# Patient Record
Sex: Female | Born: 1970 | Race: Black or African American | Hispanic: No | Marital: Single
Health system: Southern US, Community
[De-identification: ages and names within clinical notes are randomized; demographics above are authoritative.]

---

## 2008-03-20 ENCOUNTER — Ambulatory Visit: Payer: Self-pay | Admitting: Diagnostic Radiology

## 2008-03-20 ENCOUNTER — Emergency Department (HOSPITAL_BASED_OUTPATIENT_CLINIC_OR_DEPARTMENT_OTHER): Admission: EM | Admit: 2008-03-20 | Discharge: 2008-03-20 | Payer: Self-pay | Admitting: Emergency Medicine

## 2008-03-27 ENCOUNTER — Emergency Department (HOSPITAL_BASED_OUTPATIENT_CLINIC_OR_DEPARTMENT_OTHER): Admission: EM | Admit: 2008-03-27 | Discharge: 2008-03-27 | Payer: Self-pay | Admitting: Emergency Medicine

## 2008-03-27 ENCOUNTER — Ambulatory Visit: Payer: Self-pay | Admitting: Radiology

## 2009-12-29 IMAGING — CT CT HEAD W/O CM
1 series · 16 of 30 positions shown, 20 images · non-contrast
Comparison: None available.

CLINICAL DATA: Migraine headache x 2 days.

CT HEAD WITHOUT CONTRAST
TECHNIQUE: Contiguous axial images were obtained from the base of
the skull through the vertex without contrast.

[Series 2: head 4.8 h37s · axial · 0.46mm/px · z∈[-145,+7]mm · 16 of 36 slices shown, 20 images]
[im 2/36  brain]
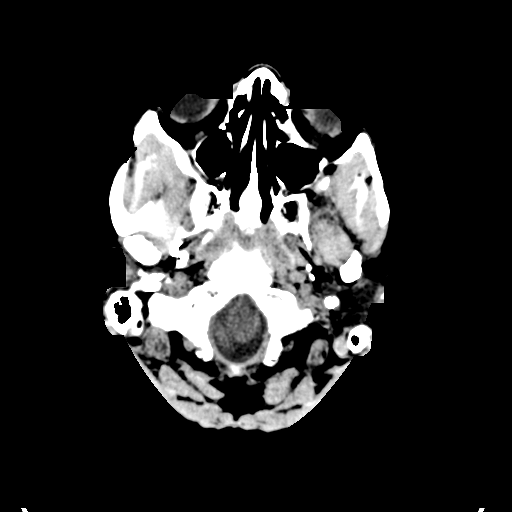
[im 2/36  bone]
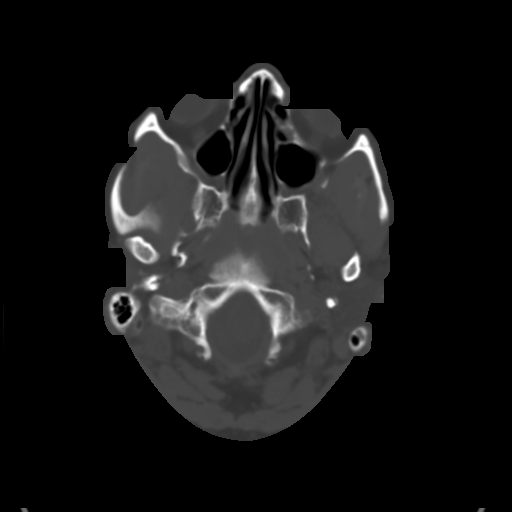
[im 4/36  brain]
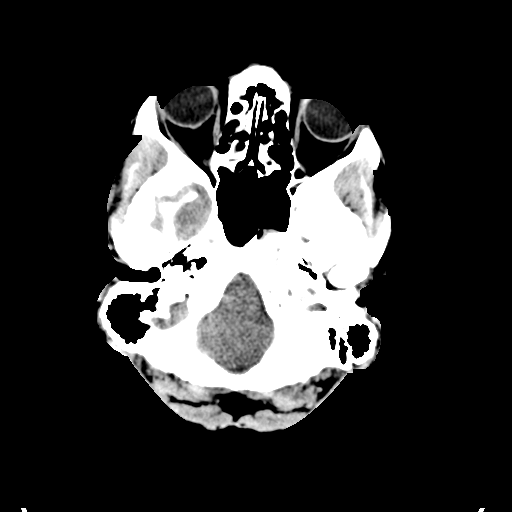
[im 7/36  brain]
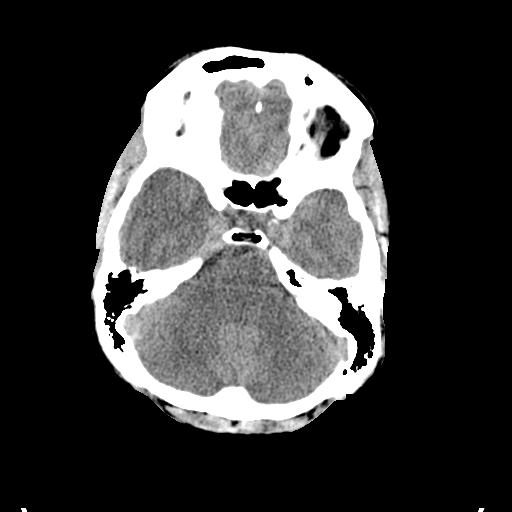
[im 9/36  brain]
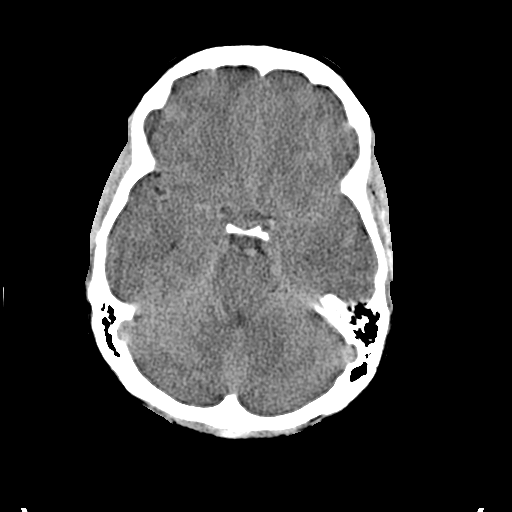
[im 10/36  brain]
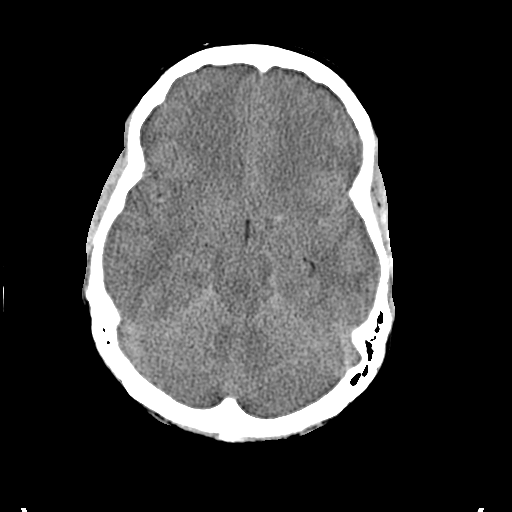
[im 10/36  bone]
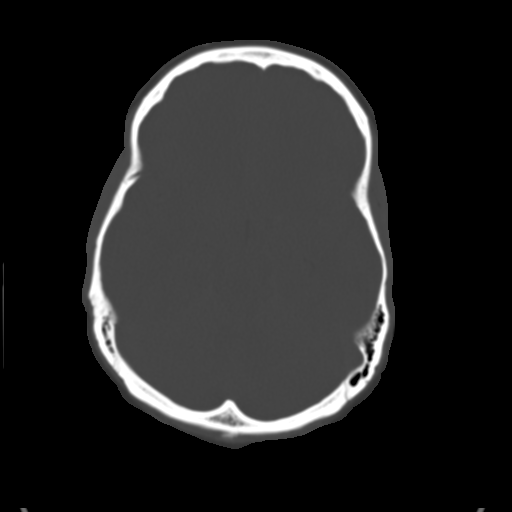
[im 13/36  brain]
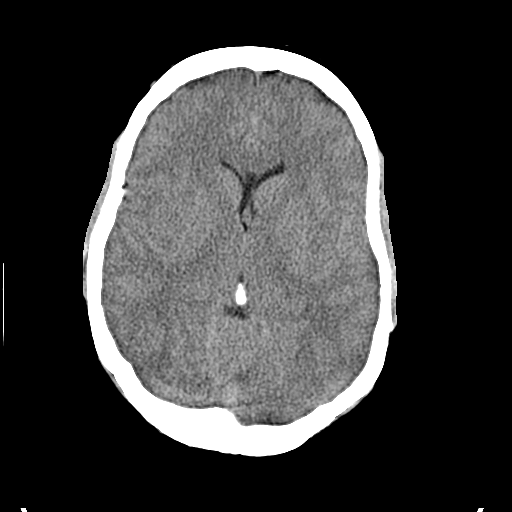
[im 15/36  brain]
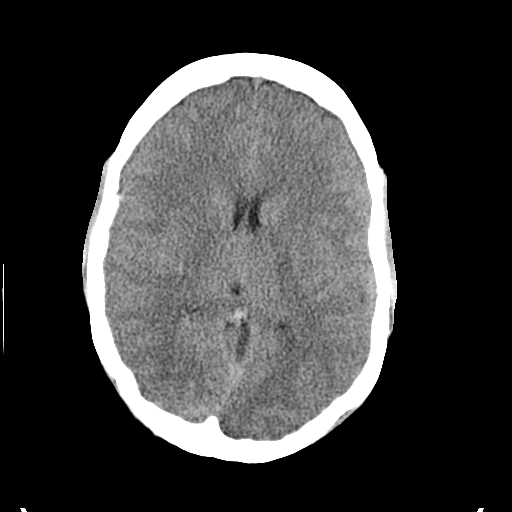
[im 17/36  brain]
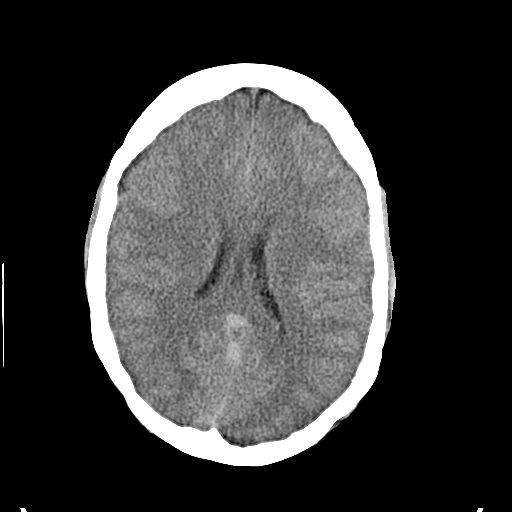
[im 19/36  brain]
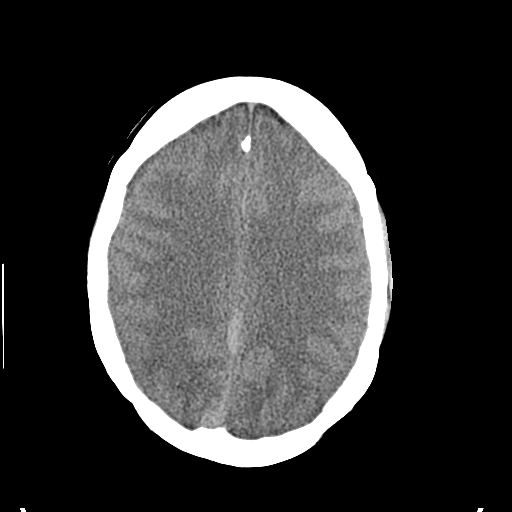
[im 19/36  bone]
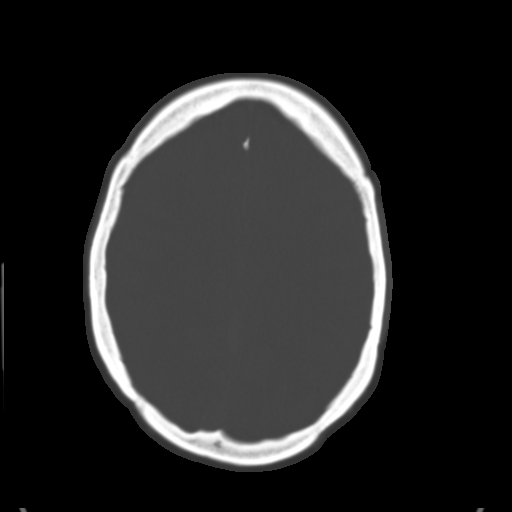
[im 21/36  brain]
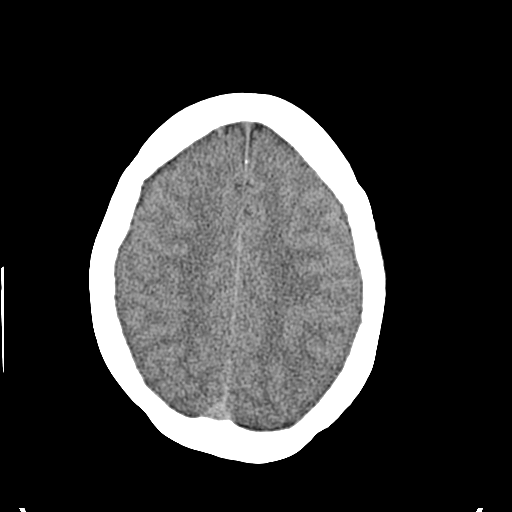
[im 23/36  brain]
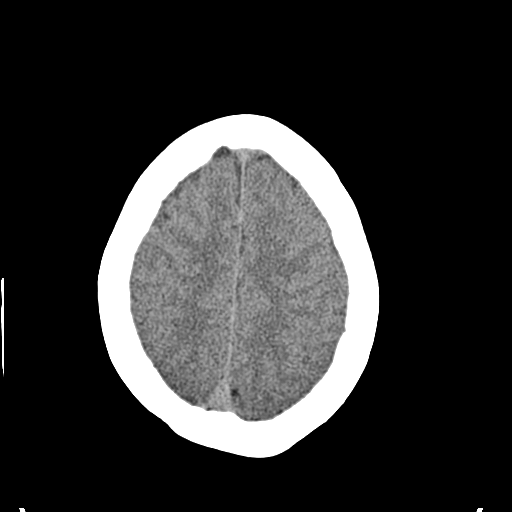
[im 26/36  brain]
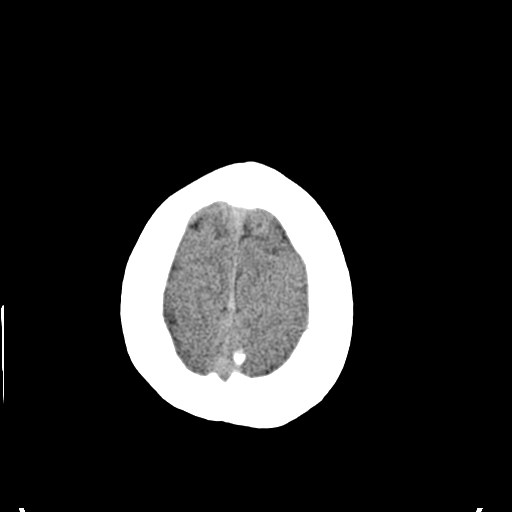
[im 27/36  brain]
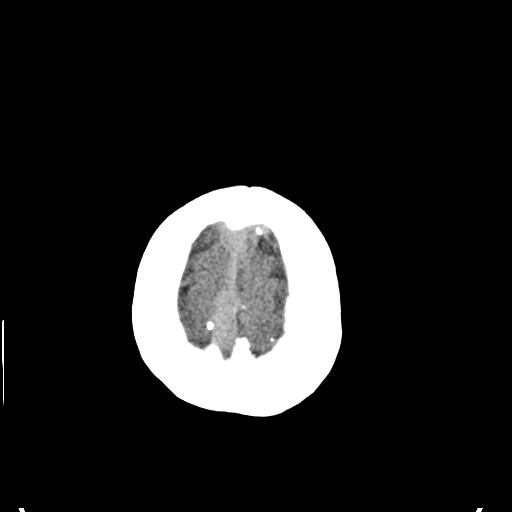
[im 27/36  bone]
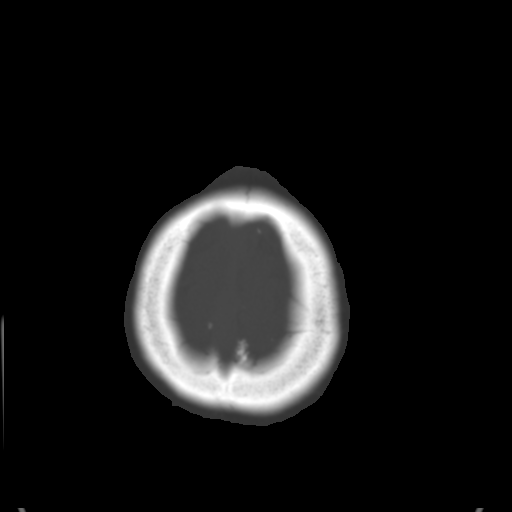
[im 29/36  brain]
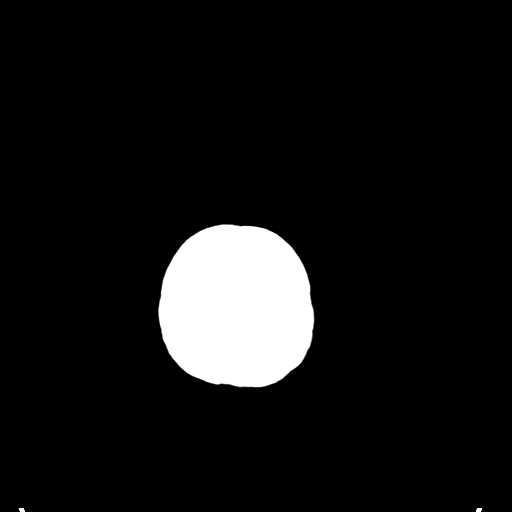
[im 32/36  brain]
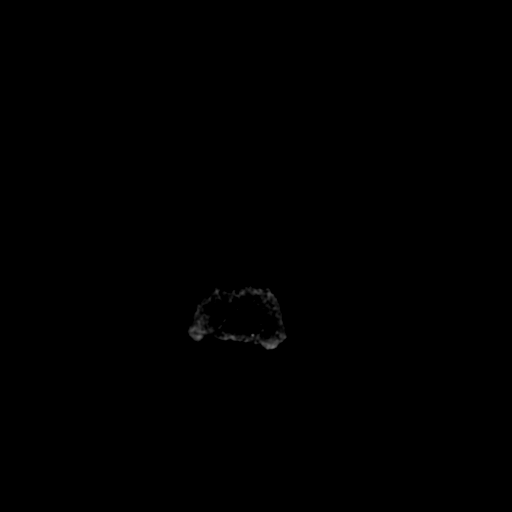
[im 34/36  brain]
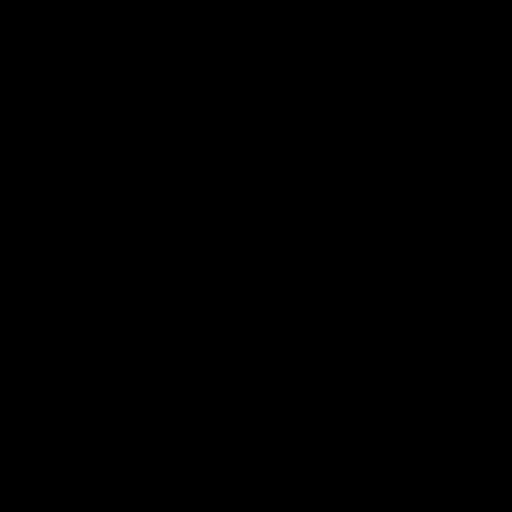

[16 of 30 positions shown; findings below may reference images not displayed]

FINDINGS: No acute intracranial abnormality is present.
Specifically, there is no evidence for acute infarct, hemorrhage,
mass, hydrocephalus, or extra-axial fluid collection.  Mild mucosal
thickening is present in the ethmoid air cells and sphenoid sinuses
bilaterally.  The paranasal sinuses and mastoid air cells are
otherwise clear.  The osseous skull is intact.
IMPRESSION: 1.  No acute intracranial abnormality.
2.  Mild sinus disease as described.

## 2010-05-01 LAB — COMPREHENSIVE METABOLIC PANEL
AST: 29 U/L (ref 0–37)
Albumin: 4.2 g/dL (ref 3.5–5.2)
BUN: 11 mg/dL (ref 6–23)
Calcium: 9.3 mg/dL (ref 8.4–10.5)
Creatinine, Ser: 0.8 mg/dL (ref 0.4–1.2)
GFR calc Af Amer: >60 mL/min (ref >60)
Total Protein: 8.6 g/dL — ABNORMAL HIGH (ref 6.0–8.3)

## 2010-05-01 LAB — CBC
HCT: 41.5 % (ref 36.0–46.0)
MCV: 84.8 fL (ref 78.0–100.0)
Platelets: 292 10*3/uL (ref 150–400)
RDW: 12.8 % (ref 11.5–15.5)
WBC: 9.5 10*3/uL (ref 4.0–10.5)

## 2010-05-01 LAB — URINALYSIS, ROUTINE W REFLEX MICROSCOPIC
Bilirubin Urine: NEGATIVE
Glucose, UA: NEGATIVE mg/dL
Nitrite: NEGATIVE
Specific Gravity, Urine: 1.026 (ref 1.005–1.030)
pH: 6 (ref 5.0–8.0)

## 2010-05-01 LAB — URINE MICROSCOPIC-ADD ON

## 2010-05-01 LAB — DIFFERENTIAL
Basophils Absolute: 0.1 10*3/uL (ref 0.0–0.1)
Eosinophils Relative: 1 % (ref 0–5)
Lymphocytes Relative: 25 % (ref 12–46)
Lymphs Abs: 2.3 10*3/uL (ref 0.7–4.0)
Monocytes Absolute: 0.5 10*3/uL (ref 0.1–1.0)
Monocytes Relative: 5 % (ref 3–12)
Neutro Abs: 6.5 10*3/uL (ref 1.7–7.7)

## 2010-05-01 LAB — PREGNANCY, URINE: Preg Test, Ur: NEGATIVE

## 2019-01-21 ENCOUNTER — Other Ambulatory Visit: Payer: Self-pay

## 2019-01-21 DIAGNOSIS — R0602 Shortness of breath: Secondary | ICD-10-CM | POA: Diagnosis present

## 2019-01-21 DIAGNOSIS — Z5321 Procedure and treatment not carried out due to patient leaving prior to being seen by health care provider: Secondary | ICD-10-CM | POA: Diagnosis not present

## 2019-01-21 DIAGNOSIS — R0789 Other chest pain: Secondary | ICD-10-CM | POA: Diagnosis not present

## 2019-01-21 NOTE — ED Triage Notes (Signed)
Patient coming from home with complaints of SOB for the past month. Patient was diagnosed with COVID on 12/14 and then with pneumonia 12/28. Patient was prescribed amoxicillin, prednisone, and a z-pac. Patient states that she thinks she is having a reaction to the medications due to shortness of breath and chest discomfort. Patient able to talk in complete sentences with no difficulty and ambulate without difficulty.

## 2019-01-22 ENCOUNTER — Emergency Department (HOSPITAL_COMMUNITY)
Admission: EM | Admit: 2019-01-22 | Discharge: 2019-01-22 | Disposition: A | Discharge status: Home / Self Care | Payer: Managed Care, Other (non HMO) | Attending: Emergency Medicine | Admitting: Emergency Medicine

## 2019-01-22 NOTE — ED Notes (Signed)
Pt upset in triage that she did not get a room. Pt continued to make comments in triage stating "This is ridiculous." Pt asked multiple times if she wants to be seen and pt continued to huff and puff stating "I just want to go home." Pt then decided that she did not want to be seen saying "I am just going to follow up with my doctor in the morning. Pt ambulated out of triage with a steady gait noted. Pt in NAD.
